# Patient Record
Sex: Male | Born: 1955 | Race: Black or African American | Hispanic: No | Marital: Single | Smoking: Never smoker
Health system: Southern US, Community
[De-identification: ages and names within clinical notes are randomized; demographics above are authoritative.]

## PROBLEM LIST (undated history)

## (undated) DIAGNOSIS — I10 Essential (primary) hypertension: Secondary | ICD-10-CM

## (undated) DIAGNOSIS — E119 Type 2 diabetes mellitus without complications: Secondary | ICD-10-CM

## (undated) DIAGNOSIS — H409 Unspecified glaucoma: Secondary | ICD-10-CM

## (undated) DIAGNOSIS — I509 Heart failure, unspecified: Secondary | ICD-10-CM

---

## 2009-03-14 ENCOUNTER — Inpatient Hospital Stay (HOSPITAL_COMMUNITY): Admission: EM | Admit: 2009-03-14 | Discharge: 2009-03-16 | Payer: Self-pay | Admitting: Emergency Medicine

## 2009-03-14 ENCOUNTER — Ambulatory Visit: Payer: Self-pay | Admitting: Gastroenterology

## 2009-03-15 ENCOUNTER — Encounter: Payer: Self-pay | Admitting: Gastroenterology

## 2009-03-17 ENCOUNTER — Telehealth: Payer: Self-pay | Admitting: Gastroenterology

## 2009-05-16 ENCOUNTER — Telehealth (INDEPENDENT_AMBULATORY_CARE_PROVIDER_SITE_OTHER): Payer: Self-pay | Admitting: *Deleted

## 2010-11-11 LAB — RETICULOCYTES
RBC.: 2 MIL/uL — ABNORMAL LOW (ref 4.22–5.81)
Retic Ct Pct: 11.2 % — ABNORMAL HIGH (ref 0.4–3.1)

## 2010-11-11 LAB — CROSSMATCH

## 2010-11-11 LAB — LIPID PANEL
Cholesterol: 134 mg/dL (ref 0–200)
HDL: 29 mg/dL — ABNORMAL LOW (ref >39)
Triglycerides: 98 mg/dL (ref <150)

## 2010-11-11 LAB — BASIC METABOLIC PANEL
BUN: 13 mg/dL (ref 6–23)
CO2: 26 mEq/L (ref 19–32)
Calcium: 7.9 mg/dL — ABNORMAL LOW (ref 8.4–10.5)
Calcium: 8.3 mg/dL — ABNORMAL LOW (ref 8.4–10.5)
Chloride: 98 mEq/L (ref 96–112)
Creatinine, Ser: 0.97 mg/dL (ref 0.4–1.5)
Creatinine, Ser: 1.05 mg/dL (ref 0.4–1.5)
GFR calc non Af Amer: >60 mL/min (ref >60)
Glucose, Bld: 255 mg/dL — ABNORMAL HIGH (ref 70–99)
Glucose, Bld: 394 mg/dL — ABNORMAL HIGH (ref 70–99)
Sodium: 131 mEq/L — ABNORMAL LOW (ref 135–145)
Sodium: 133 mEq/L — ABNORMAL LOW (ref 135–145)

## 2010-11-11 LAB — DIFFERENTIAL
Basophils Absolute: 0 10*3/uL (ref 0.0–0.1)
Basophils Relative: 0 % (ref 0–1)
Eosinophils Absolute: 0.1 10*3/uL (ref 0.0–0.7)
Monocytes Absolute: 0.9 10*3/uL (ref 0.1–1.0)
Monocytes Relative: 6 % (ref 3–12)
Neutro Abs: 13.5 10*3/uL — ABNORMAL HIGH (ref 1.7–7.7)

## 2010-11-11 LAB — HEMOGLOBIN A1C
Hgb A1c MFr Bld: 8.2 % — ABNORMAL HIGH (ref 4.6–6.1)
Mean Plasma Glucose: 189 mg/dL

## 2010-11-11 LAB — IRON AND TIBC
Iron: <10 ug/dL — ABNORMAL LOW (ref 42–135)
UIBC: 254 ug/dL

## 2010-11-11 LAB — VITAMIN B12: Vitamin B-12: 1616 pg/mL — ABNORMAL HIGH (ref 211–911)

## 2010-11-11 LAB — ABO/RH: ABO/RH(D): A POS

## 2010-11-11 LAB — HEMOGLOBIN AND HEMATOCRIT, BLOOD
HCT: 24.2 % — ABNORMAL LOW (ref 39.0–52.0)
Hemoglobin: 7.9 g/dL — CL (ref 13.0–17.0)

## 2010-11-11 LAB — POCT CARDIAC MARKERS: Troponin i, poc: <0.05 ng/mL (ref 0.00–0.09)

## 2010-11-11 LAB — CBC
HCT: 20.7 % — ABNORMAL LOW (ref 39.0–52.0)
Hemoglobin: 6 g/dL — CL (ref 13.0–17.0)
Hemoglobin: 8.6 g/dL — ABNORMAL LOW (ref 13.0–17.0)
MCHC: 32.9 g/dL (ref 30.0–36.0)
MCHC: 33.2 g/dL (ref 30.0–36.0)
MCV: 89.9 fL (ref 78.0–100.0)
MCV: 90.6 fL (ref 78.0–100.0)
Platelets: 334 10*3/uL (ref 150–400)
RBC: 1.99 MIL/uL — ABNORMAL LOW (ref 4.22–5.81)
RBC: 2.3 MIL/uL — ABNORMAL LOW (ref 4.22–5.81)
RDW: 14.4 % (ref 11.5–15.5)
RDW: 14.4 % (ref 11.5–15.5)
WBC: 12.2 10*3/uL — ABNORMAL HIGH (ref 4.0–10.5)

## 2010-11-11 LAB — COMPREHENSIVE METABOLIC PANEL
BUN: 19 mg/dL (ref 6–23)
CO2: 26 mEq/L (ref 19–32)
Calcium: 8.1 mg/dL — ABNORMAL LOW (ref 8.4–10.5)
Chloride: 98 mEq/L (ref 96–112)
Creatinine, Ser: 1 mg/dL (ref 0.4–1.5)
GFR calc non Af Amer: >60 mL/min (ref >60)
Total Bilirubin: 0.7 mg/dL (ref 0.3–1.2)

## 2010-11-11 LAB — GLUCOSE, CAPILLARY
Glucose-Capillary: 211 mg/dL — ABNORMAL HIGH (ref 70–99)
Glucose-Capillary: 251 mg/dL — ABNORMAL HIGH (ref 70–99)
Glucose-Capillary: 270 mg/dL — ABNORMAL HIGH (ref 70–99)
Glucose-Capillary: 271 mg/dL — ABNORMAL HIGH (ref 70–99)
Glucose-Capillary: 287 mg/dL — ABNORMAL HIGH (ref 70–99)

## 2010-11-11 LAB — T4, FREE: Free T4: 1.05 ng/dL (ref 0.80–1.80)

## 2010-11-11 LAB — PROTIME-INR: INR: 1.1 (ref 0.00–1.49)

## 2010-12-18 NOTE — H&P (Signed)
Timothy Malone, Timothy Malone               ACCOUNT NO.:  [id number]   MEDICAL RECORD NO.:  [id number]          PATIENT TYPE:  INP   LOCATION:  5524                         FACILITY:  [facility]   PHYSICIAN:  Renee Ramus, MD       DATE OF BIRTH:  28-Jan-1956   DATE OF ADMISSION:  03/14/2009  DATE OF DISCHARGE:                              HISTORY & PHYSICAL   PRIMARY CARE PHYSICIAN:  Renaye Rakers, MD   HISTORY OF PRESENT ILLNESS:  The patient is a 55 year old male with  recent history of black tarry stools.  The patient reports having black  tarry stools for approximately 3 weeks prior to admission.  The patient  has had up to 3 syncopal episodes prior to admission.  He was seen by  his primary care physician.  He had labs drawn.  She noted that his  hemoglobin was severely decreased at 6.5.  She asked him to return to  clinic, but prior to his return, he had another syncopal episode.  The  patient was brought to the emergency department and there he was found  to be in profound anemia with a hemoglobin of 6.0.  The patient denies  previous history of GI bleed.  Denies any type of coagulopathy or any  type of recent trauma or predisposing acts towards GI bleeding.  He does  admit to taking Aleve and ibuprofen on a p.r.n. basis.  The patient  denies fevers, chills, night sweats, nausea, vomiting, chest pain,  shortness breath, PND, and orthopnea.  The patient is currently stable,  and we are admitting him for further evaluation and treatment.   PAST MEDICAL HISTORY:  1. Diabetes mellitus type 2, uncontrolled.  2. Hypertension.  3. Hypercholesterolemia.   SOCIAL HISTORY:  The patient reports drinking 2-3 beers daily.  Denies  tobacco or drug use.   Family history is not available.   REVIEW OF SYSTEMS:  All other comprehensive review systems are negative.   MEDICATIONS:  The patient has no known drug allergies.   CURRENT MEDICATIONS:  1. Metformin 1 g p.o. q.a.m. and 1500 mg p.o. nightly.  2. Actos 30 mg p.o. daily.  3. Lisinopril/hydrochlorothiazide 20/12.5 one p.o. daily.  4. Incretin which is his study drug.  He may or may not be on this      drug.  He may be on placebo arm, we are not sure at this point.   PHYSICAL EXAMINATION:  GENERAL:  This is a well-developed, well-  nourished black male currently in no apparent distress.  VITAL SIGNS:  Blood pressure 103/60, heart rate 117, respiratory rate  22, temperature 98.  HEENT:  Oropharynx is clear.  Mucous membranes pink and moist.  TMs  clear bilaterally.  Pupils equal, reactive to light and accommodation.  Extraocular muscles are intact.  NECK:  No jugular venous distention or lymphadenopathy.  CARDIOVASCULAR:  Regular rate and rhythm without murmurs, rubs, or  gallops.  PULMONARY:  Lungs are clear to auscultation bilaterally.  ABDOMEN:  Soft, nontender, nondistended without hepatosplenomegaly.  Bowel sounds are present.  He has no rebound or guarding.  EXTREMITIES:  He has no clubbing, cyanosis, or edema.  He has good  peripheral pulses in dorsalis pedis and radial arteries.  He is able to  move all extremities.  NEURO:  Cranial nerves II through XII are grossly intact.  He has no  focal neurological deficits.   STUDIES:  1. Chest x-ray shows no acute disease.  2. EKG shows sinus tachycardia with one premature ventricular      contraction, but no other abnormalities.   LABORATORY DATA:  White count 15.2, H and H 6.0 and 18, MCV 91,  platelets 421.  Sodium 131, potassium 4.5, chloride 98, bicarb 26, BUN  15, creatinine 1.0, glucose 394.  Fecal occult blood is positive.   ASSESSMENT/PLAN:  1. Gastrointestinal bleed.  The patient is likely suffering from a GI      bleed, which is likely upper given his presentation, may be      gastritis or ulcer secondary to alcohol and NSAID use.  The patient      reports having colonoscopy last year with 4 polyps found.  He does      not have a report of this and this was done  [place].  We have      consulted [facility].  We will check a PT/INR.  An acute anemia      panel is pending.  We have also discussed this with the emergency      room physician and anticipate the patient will have an EGD done      either today or tomorrow and if necessary colonoscopy.  We will      transfuse him 2 units packed RBCs and keep 2 units in reserve.  We      will start him on PPI and give him liquid diet.  2. Diabetes mellitus type 2, uncontrolled.  We will place the patient      on low-level Lantus, place him on sliding scale insulin.  For now,      we will hold p.o. medications.  We will check hemoglobin A1c and      lipid panel.  3. Hypertension.  Since the patient is currently hypotensive and      anemic, we will hold his blood pressure medications, but I would      anticipate starting him on at least lisinopril at discharge.  4. Disposition.  The patient is full code.   H and P was constructed by reviewing past medical history conferring  with emergency medical room physician and reviewing the emergency  medical record.   TIME SPENT:  1 hour.      Renee Ramus, MD  Electronically Signed     JF/MEDQ  D:  03/14/2009  T:  03/15/2009  Job:  [id number]   cc:   Renaye Rakers, M.D.

## 2010-12-18 NOTE — Discharge Summary (Signed)
Timothy Malone, Timothy Malone               ACCOUNT NO.:  [id number]   MEDICAL RECORD NO.:  [id number]          PATIENT TYPE:  INP   LOCATION:  5524                         FACILITY:  [facility]   PHYSICIAN:  Charlestine Massed, MDDATE OF BIRTH:  08-13-1955   DATE OF ADMISSION:  03/14/2009  DATE OF DISCHARGE:  03/16/2009                               DISCHARGE SUMMARY   PRIMARY CARE PHYSICIAN:  Renaye Rakers, MD   GASTROENTEROLOGY:  Rachael Fee, MD   REASON FOR ADMISSION:  Black tarry stools and syncopal episodes.   DISCHARGE DIAGNOSES:  1. Upper need gastrointestinal bleed with multiple gastric ulcers      secondary to nonsteroidal antiinflammatory drug use.  2. Anemia of acute blood loss with syncope secondary to hypovolemia,      currently corrected.  3. Diabetes mellitus type 2, uncontrolled and noncompliance.  4. Hypertension, currently controlled.  5. Dyslipidemia with low HDL levels.   DISCHARGE MEDICATIONS:  1. Metformin 500 mg tablets 2 tablets in the a.m. and 3 tablets at      night by mouth.  2. Actos 30 mg by mouth daily.  3. Incretin - study drug 1 tablet by mouth daily.  4. Lisinopril 20 mg by mouth daily.  5. Niaspan 1000 mg by mouth at night with meals.   The medication that was stopped is lisinopril/hydrochlorothiazide  combination.  The patient has been taken off hydrochlorothiazide.   HOSPITAL COURSE:  1. Upper GI bleed.  Mr. Arcadio Cope is a 55 year old African      American male who came with complaints of black tarry stools over      the past 2-3 weeks.  He has been taking high doses of nonsteroidal      anti-inflammatory drugs for his back pain on a p.r.n. basis,      especially Aleve and ibuprofen.  His hemoglobin was found to be 6.5      and he had 2-3 syncopal episode prior to admission also.  He was      put on n.p.o. and started on IV blood transfusion.  The patient was      seen by [facility] and underwent endoscopy by Dr.      Rob Bunting and endoscopy revealed a large deep gastric ulcer in      the proximal body of the stomach.  We discussed with him the cause.      No idea of cause on visible vessel present with a clear base.  The      patient's mucous was not adenomatous.  There was also a 1-2 cm      clean-based duodenal bulb ulcer causing anatomic deformity and the      biopsies were taken for H. pylori and mild reflux esophagitis was      also seen.  The pathology report is still pending.      Gastroenterology has written that their office will call the      patient for further followup.  Pathology reports will also be      followed by Gastroenterology, as they ordered it at the time  of EGD      and the patient will be started on appropriate H. pylori regimen if      is H. pylori positive.   Currently, his hemoglobin is stable and GI followup has been done, and  he has been advised clearly not to avoid NSAIDs for any reason.  The  patient will need a repeat EGD in another 2 months period as per GI and  [facility] will set it up as outpatient.  Currently, the patient's  hemoglobin is stable.  He has been started on regular consistency diet  and if he tolerates, he will be discharged in the afternoon.  1. Anemia of acute blood loss.  The patient has been transfused and      his hemoglobin is currently stable and no further drop seen and no      evidence of any further bleed.  2. Diabetes.  The patient has been on Incretin study drug and      metformin and Actos.  We will continue them.  He will also need to      be on NovoLog sliding scale and the patient has been educated here,      so we will continue that for proper coverage as his hemoglobin A1c      is 8.2.  3. Hypertension.  He has been discontinued off hydrochlorothiazide.      We will continue only on lisinopril for his hypertension and to      follow up with Dr. Renaye Rakers for further followup of that.  4. Dyslipidemia.  His LDL is normal but he has  a low HDL and in view      of his metabolic syndrome, I will add Niaspan to the regimen and      the patient has to take the aspirin.  In view of the fact that the      patient has upper GI bleed, he cannot take aspirin and so he will      just take an aspirin with a meal and she has to explain about the      side effects of the aspirin.   DISPOSITION:  Discharged back home.   FOLLOWUP:  1. Follow up with Dr. Parke Simmers in 1 week's time. Dr.Bland to check the      CBC at that timer to see his hemoglobin level.  2. Follow up with Dr. Rob Bunting and [facility] will call for      appointment.   INSTRUCTIONS:  1. Avoid nonsteroidal drugs, especially Aleve, ibuprofen, Advil,      Imuran, and to take just Tylenol for any pain.  2. Proper medication adherence and blood sugar control.   TIME SPENT:  A total of 40 minutes were spent on today's discharge.      Charlestine Massed, MD  Electronically Signed     UT/MEDQ  D:  03/16/2009  T:  03/17/2009  Job:  [id number]   cc:   Renaye Rakers, M.D.  Rachael Fee, MD

## 2015-09-08 ENCOUNTER — Inpatient Hospital Stay
Admission: EM | Admit: 2015-09-08 | Discharge: 2015-09-10 | DRG: 640 | Disposition: A | Discharge status: Home / Self Care | Payer: Federal, State, Local not specified - PPO | Attending: Internal Medicine | Admitting: Internal Medicine

## 2015-09-08 ENCOUNTER — Inpatient Hospital Stay: Payer: Federal, State, Local not specified - PPO

## 2015-09-08 ENCOUNTER — Encounter: Payer: Self-pay | Admitting: Emergency Medicine

## 2015-09-08 DIAGNOSIS — R609 Edema, unspecified: Secondary | ICD-10-CM

## 2015-09-08 DIAGNOSIS — E119 Type 2 diabetes mellitus without complications: Secondary | ICD-10-CM | POA: Diagnosis present

## 2015-09-08 DIAGNOSIS — I110 Hypertensive heart disease with heart failure: Secondary | ICD-10-CM | POA: Diagnosis present

## 2015-09-08 DIAGNOSIS — R600 Localized edema: Secondary | ICD-10-CM

## 2015-09-08 DIAGNOSIS — Z794 Long term (current) use of insulin: Secondary | ICD-10-CM

## 2015-09-08 DIAGNOSIS — I340 Nonrheumatic mitral (valve) insufficiency: Secondary | ICD-10-CM | POA: Diagnosis not present

## 2015-09-08 DIAGNOSIS — L0100 Impetigo, unspecified: Secondary | ICD-10-CM | POA: Diagnosis present

## 2015-09-08 DIAGNOSIS — F1010 Alcohol abuse, uncomplicated: Secondary | ICD-10-CM | POA: Diagnosis present

## 2015-09-08 DIAGNOSIS — E876 Hypokalemia: Secondary | ICD-10-CM | POA: Diagnosis present

## 2015-09-08 DIAGNOSIS — I5033 Acute on chronic diastolic (congestive) heart failure: Secondary | ICD-10-CM | POA: Diagnosis present

## 2015-09-08 DIAGNOSIS — H409 Unspecified glaucoma: Secondary | ICD-10-CM | POA: Diagnosis present

## 2015-09-08 DIAGNOSIS — Z79899 Other long term (current) drug therapy: Secondary | ICD-10-CM | POA: Diagnosis not present

## 2015-09-08 DIAGNOSIS — E870 Hyperosmolality and hypernatremia: Secondary | ICD-10-CM | POA: Diagnosis present

## 2015-09-08 DIAGNOSIS — T502X5A Adverse effect of carbonic-anhydrase inhibitors, benzothiadiazides and other diuretics, initial encounter: Secondary | ICD-10-CM | POA: Diagnosis present

## 2015-09-08 DIAGNOSIS — R29898 Other symptoms and signs involving the musculoskeletal system: Secondary | ICD-10-CM

## 2015-09-08 HISTORY — DX: Heart failure, unspecified: I50.9

## 2015-09-08 HISTORY — DX: Essential (primary) hypertension: I10

## 2015-09-08 HISTORY — DX: Unspecified glaucoma: H40.9

## 2015-09-08 HISTORY — DX: Type 2 diabetes mellitus without complications: E11.9

## 2015-09-08 LAB — COMPREHENSIVE METABOLIC PANEL
ALK PHOS: 85 U/L (ref 38–126)
ALT: 73 U/L — ABNORMAL HIGH (ref 17–63)
ANION GAP: 7 (ref 5–15)
AST: 49 U/L — ABNORMAL HIGH (ref 15–41)
Albumin: 2.6 g/dL — ABNORMAL LOW (ref 3.5–5.0)
BILIRUBIN TOTAL: 1 mg/dL (ref 0.3–1.2)
BUN: 15 mg/dL (ref 6–20)
CALCIUM: 7.4 mg/dL — AB (ref 8.9–10.3)
CO2: 42 mmol/L — ABNORMAL HIGH (ref 22–32)
Chloride: 94 mmol/L — ABNORMAL LOW (ref 101–111)
Creatinine, Ser: 0.6 mg/dL — ABNORMAL LOW (ref 0.61–1.24)
Glucose, Bld: 63 mg/dL — ABNORMAL LOW (ref 65–99)
Sodium: 143 mmol/L (ref 135–145)
TOTAL PROTEIN: 5.5 g/dL — AB (ref 6.5–8.1)

## 2015-09-08 LAB — CBC
HEMATOCRIT: 37.5 % — AB (ref 40.0–52.0)
HEMOGLOBIN: 12.2 g/dL — AB (ref 13.0–18.0)
MCH: 30.8 pg (ref 26.0–34.0)
MCHC: 32.6 g/dL (ref 32.0–36.0)
MCV: 94.5 fL (ref 80.0–100.0)
Platelets: 282 10*3/uL (ref 150–440)
RBC: 3.96 MIL/uL — ABNORMAL LOW (ref 4.40–5.90)
RDW: 13.3 % (ref 11.5–14.5)
WBC: 13.1 10*3/uL — AB (ref 3.8–10.6)

## 2015-09-08 LAB — MAGNESIUM: MAGNESIUM: 1.7 mg/dL (ref 1.7–2.4)

## 2015-09-08 LAB — GLUCOSE, CAPILLARY: Glucose-Capillary: 225 mg/dL — ABNORMAL HIGH (ref 65–99)

## 2015-09-08 MED ORDER — HEPARIN SODIUM (PORCINE) 5000 UNIT/ML IJ SOLN
5000.0000 [IU] | Freq: Three times a day (TID) | INTRAMUSCULAR | Status: DC
  Administered 2015-09-09 – 2015-09-10 (×5): 5000 [IU] via SUBCUTANEOUS
  Filled 2015-09-08 (×5): qty 1

## 2015-09-08 MED ORDER — INSULIN ASPART 100 UNIT/ML ~~LOC~~ SOLN
0.0000 [IU] | Freq: Every day | SUBCUTANEOUS | Status: DC
  Administered 2015-09-09: 5 [IU] via SUBCUTANEOUS
  Administered 2015-09-09: 2 [IU] via SUBCUTANEOUS
  Filled 2015-09-08: qty 2
  Filled 2015-09-08 (×2): qty 5

## 2015-09-08 MED ORDER — LORAZEPAM 2 MG/ML IJ SOLN: 1.0000 mg | Freq: Four times a day (QID) | INTRAMUSCULAR | Status: DC | PRN

## 2015-09-08 MED ORDER — SODIUM CHLORIDE 0.9% FLUSH: 3.0000 mL | INTRAVENOUS | Status: DC | PRN

## 2015-09-08 MED ORDER — ADULT MULTIVITAMIN W/MINERALS CH
1.0000 | ORAL_TABLET | Freq: Every day | ORAL | Status: DC
  Administered 2015-09-09 – 2015-09-10 (×2): 1 via ORAL
  Filled 2015-09-08 (×2): qty 1

## 2015-09-08 MED ORDER — FOLIC ACID 1 MG PO TABS
1.0000 mg | ORAL_TABLET | Freq: Every day | ORAL | Status: DC
  Administered 2015-09-09 – 2015-09-10 (×2): 1 mg via ORAL
  Filled 2015-09-08 (×2): qty 1

## 2015-09-08 MED ORDER — ONDANSETRON HCL 4 MG/2ML IJ SOLN: 4.0000 mg | Freq: Four times a day (QID) | INTRAMUSCULAR | Status: DC | PRN

## 2015-09-08 MED ORDER — THIAMINE HCL 100 MG/ML IJ SOLN
100.0000 mg | Freq: Every day | INTRAMUSCULAR | Status: DC
  Filled 2015-09-08: qty 2

## 2015-09-08 MED ORDER — SODIUM CHLORIDE 0.9 % IV SOLN: 250.0000 mL | INTRAVENOUS | Status: DC | PRN

## 2015-09-08 MED ORDER — INSULIN ASPART 100 UNIT/ML ~~LOC~~ SOLN
0.0000 [IU] | Freq: Three times a day (TID) | SUBCUTANEOUS | Status: DC
  Administered 2015-09-09: 9 [IU] via SUBCUTANEOUS
  Administered 2015-09-09: 5 [IU] via SUBCUTANEOUS
  Administered 2015-09-09 – 2015-09-10 (×2): 7 [IU] via SUBCUTANEOUS
  Administered 2015-09-10: 3 [IU] via SUBCUTANEOUS
  Administered 2015-09-10: 5 [IU] via SUBCUTANEOUS
  Filled 2015-09-08: qty 7
  Filled 2015-09-08: qty 5
  Filled 2015-09-08: qty 7
  Filled 2015-09-08: qty 3
  Filled 2015-09-08: qty 9

## 2015-09-08 MED ORDER — METOPROLOL TARTRATE 25 MG PO TABS
25.0000 mg | ORAL_TABLET | Freq: Two times a day (BID) | ORAL | Status: DC
  Administered 2015-09-09 – 2015-09-10 (×4): 25 mg via ORAL
  Filled 2015-09-08 (×4): qty 1

## 2015-09-08 MED ORDER — ALBUTEROL SULFATE (2.5 MG/3ML) 0.083% IN NEBU: 2.5000 mg | INHALATION_SOLUTION | RESPIRATORY_TRACT | Status: DC | PRN

## 2015-09-08 MED ORDER — ACETAMINOPHEN 325 MG PO TABS: 650.0000 mg | ORAL_TABLET | Freq: Four times a day (QID) | ORAL | Status: DC | PRN

## 2015-09-08 MED ORDER — LORAZEPAM 1 MG PO TABS: 1.0000 mg | ORAL_TABLET | Freq: Four times a day (QID) | ORAL | Status: DC | PRN

## 2015-09-08 MED ORDER — SODIUM CHLORIDE 0.9% FLUSH
3.0000 mL | Freq: Two times a day (BID) | INTRAVENOUS | Status: DC
  Administered 2015-09-08 – 2015-09-10 (×3): 3 mL via INTRAVENOUS

## 2015-09-08 MED ORDER — SODIUM CHLORIDE 0.9% FLUSH: 3.0000 mL | Freq: Two times a day (BID) | INTRAVENOUS | Status: DC

## 2015-09-08 MED ORDER — MAGNESIUM SULFATE 2 GM/50ML IV SOLN
2.0000 g | Freq: Once | INTRAVENOUS | Status: AC
  Administered 2015-09-08: 2 g via INTRAVENOUS
  Filled 2015-09-08: qty 50

## 2015-09-08 MED ORDER — ONDANSETRON HCL 4 MG PO TABS: 4.0000 mg | ORAL_TABLET | Freq: Four times a day (QID) | ORAL | Status: DC | PRN

## 2015-09-08 MED ORDER — FUROSEMIDE 10 MG/ML IJ SOLN
20.0000 mg | Freq: Two times a day (BID) | INTRAMUSCULAR | Status: DC
  Administered 2015-09-09 (×3): 20 mg via INTRAVENOUS
  Filled 2015-09-08 (×3): qty 2

## 2015-09-08 MED ORDER — INSULIN GLARGINE 100 UNIT/ML ~~LOC~~ SOLN
30.0000 [IU] | Freq: Every day | SUBCUTANEOUS | Status: DC
  Administered 2015-09-09: 30 [IU] via SUBCUTANEOUS
  Filled 2015-09-08 (×3): qty 0.3

## 2015-09-08 MED ORDER — POTASSIUM CHLORIDE 10 MEQ/100ML IV SOLN
10.0000 meq | INTRAVENOUS | Status: AC
  Administered 2015-09-08 (×2): 10 meq via INTRAVENOUS
  Filled 2015-09-08 (×3): qty 100

## 2015-09-08 MED ORDER — POTASSIUM CHLORIDE CRYS ER 20 MEQ PO TBCR
40.0000 meq | EXTENDED_RELEASE_TABLET | Freq: Two times a day (BID) | ORAL | Status: DC
  Administered 2015-09-09: 40 meq via ORAL
  Filled 2015-09-08: qty 2

## 2015-09-08 MED ORDER — ACETAMINOPHEN 650 MG RE SUPP: 650.0000 mg | Freq: Four times a day (QID) | RECTAL | Status: DC | PRN

## 2015-09-08 MED ORDER — VITAMIN B-1 100 MG PO TABS
100.0000 mg | ORAL_TABLET | Freq: Every day | ORAL | Status: DC
  Administered 2015-09-09 – 2015-09-10 (×2): 100 mg via ORAL
  Filled 2015-09-08 (×2): qty 1

## 2015-09-08 MED ORDER — POTASSIUM CHLORIDE 10 MEQ/100ML IV SOLN
10.0000 meq | INTRAVENOUS | Status: AC
  Administered 2015-09-09 (×3): 10 meq via INTRAVENOUS
  Filled 2015-09-08 (×4): qty 100

## 2015-09-08 NOTE — ED Notes (Signed)
Pt sent here from [facility] for hypokalemia and bilat leg edema.  Pt states noted swelling for few days.  Large amt of pitting edema noted to LE extremities bilat.  Healing ulcers noted to legs and superficial abrasions x 2 noted to left leg from patient accidentally scratching his leg. Pt has no complaints of pain or shob, denies hx of the same.  Weeping noted to both legs, sterile gauze and coban applied to left leg abrasions.

## 2015-09-08 NOTE — ED Notes (Signed)
Patient went to [facility] for Bilateral pedal edema. Sent here for hypokalemia

## 2015-09-08 NOTE — ED Notes (Signed)
Dr Imogene Burn in to admit

## 2015-09-08 NOTE — ED Notes (Signed)
Pt awaiting for room assignment, no distress noted.

## 2015-09-08 NOTE — H&P (Addendum)
[facility]   PATIENT NAME: Timothy Malone    MR#:  [id number]  DATE OF BIRTH:  1955-09-04  DATE OF ADMISSION:  09/08/2015  PRIMARY CARE PHYSICIAN: No primary care provider on file.   REQUESTING/REFERRING PHYSICIAN: Sharyn Creamer, MD  CHIEF COMPLAINT:   Chief Complaint  Patient presents with  . Leg Swelling   bilateral leg swelling for 10 days.  HISTORY OF PRESENT ILLNESS:  Timothy Malone  is a 60 y.o. male with a known history of hypertension, diabetes and CHF. The patient has had the leg swelling for the past 10 days. He was found to have low potassium in the urgent care and sent to ED for further evaluation. He denies any fever or chills, no chest pain, palpitation or cough or shortness breath. No orthopnea or nocturnal dyspnea. But has generalized weakness. His potassium in the ED is less than 2. He is being treated with potassium IV 20 mEq now.  PAST MEDICAL HISTORY:   Past Medical History  Diagnosis Date  . CHF (congestive heart failure) (HCC)   . Diabetes mellitus without complication (HCC)   . Glaucoma   . Hypertension     PAST SURGICAL HISTORY:  History reviewed. No pertinent past surgical history.  SOCIAL HISTORY:   Social History  Substance Use Topics  . Smoking status: Not on file  . Smokeless tobacco: Not on file  . Alcohol Use: 1.2 oz/week    2 Cans of beer per week   he denies any smoking or drug abuse.   FAMILY HISTORY:  No family history on file. the patient denies any family history of hypertension, diabetes, heart attack or stroke or cancer.   DRUG ALLERGIES:  No Known Allergies  REVIEW OF SYSTEMS:  CONSTITUTIONAL: No fever,but has generalized weakness.  EYES: No blurred or double vision.  EARS, NOSE, AND THROAT: No tinnitus or ear pain.  RESPIRATORY: No cough, shortness of breath, wheezing or hemoptysis.  CARDIOVASCULAR: No chest pain, orthopnea,  but has bilateral leg edema.  GASTROINTESTINAL: No  nausea, vomiting, diarrhea or abdominal pain.  GENITOURINARY: No dysuria, hematuria.  ENDOCRINE: No polyuria, nocturia,  HEMATOLOGY: No anemia, easy bruising or bleeding SKIN: No rash or lesion. MUSCULOSKELETAL: No joint pain or arthritis.   NEUROLOGIC: No tingling, numbness, weakness.  PSYCHIATRY: No anxiety or depression.   MEDICATIONS AT HOME:   Prior to Admission medications   Medication Sig Start Date End Date Taking? Authorizing Provider  furosemide (LASIX) 20 MG tablet Take 20 mg by mouth daily.   Yes Historical Provider, MD  insulin aspart (NOVOLOG) 100 UNIT/ML injection Inject 0-25 Units into the skin 3 (three) times daily with meals as needed for high blood sugar. Pt uses as needed per sliding scale.   Yes Historical Provider, MD  insulin glargine (LANTUS) 100 UNIT/ML injection Inject 30 Units into the skin at bedtime.    Yes Historical Provider, MD      VITAL SIGNS:  Blood pressure 168/95, pulse 63, temperature 98 F (36.7 C), temperature source Oral, resp. rate 7, height  (1.88 m), weight 122.018 kg (269 lb), SpO2 90 %.  PHYSICAL EXAMINATION:  GENERAL:  60 y.o.-year-old patient lying in the bed with no acute distress.  morbid obese.  EYES: Pupils equal, round, reactive to light and accommodation. No scleral icterus. Extraocular muscles intact.  HEENT: Head atraumatic, normocephalic. Oropharynx and nasopharynx clear.  NECK:  Supple, no jugular venous distention. No thyroid enlargement, no tenderness.  LUNGS:  Normal breath sounds bilaterally, no wheezing, rales,rhonchi or crepitation. No use of accessory muscles of respiration.  CARDIOVASCULAR: S1, S2 normal. No murmurs, rubs, or gallops.  ABDOMEN: Soft, nontender, nondistended. Bowel sounds present. No organomegaly or mass.  EXTREMITIES:   bilateral leg edema 3+, no cyanosis, or clubbing.  NEUROLOGIC: Cranial nerves II through XII are intact. Muscle strength 4/5 in all extremities. Sensation intact. Gait not checked.   PSYCHIATRIC: The patient is alert and oriented x 3.  SKIN: No obvious rash, lesion, or ulcer.   LABORATORY PANEL:   CBC  Recent Labs Lab 09/08/15 1840  WBC 13.1*  HGB 12.2*  HCT 37.5*  PLT 282   ------------------------------------------------------------------------------------------------------------------  Chemistries   Recent Labs Lab 09/08/15 1840  NA 143  K <2.0*  CL 94*  CO2 42*  GLUCOSE 63*  BUN 15  CREATININE 0.60*  CALCIUM 7.4*  AST 49*  ALT 73*  ALKPHOS 85  BILITOT 1.0   ------------------------------------------------------------------------------------------------------------------  Cardiac Enzymes No results for input(s): TROPONINI in the last 168 hours. ------------------------------------------------------------------------------------------------------------------  RADIOLOGY:  No results found.  EKG:   Orders placed or performed during the hospital encounter of 09/08/15  . EKG 12-Lead  . EKG 12-Lead  . ED EKG  . ED EKG    IMPRESSION AND PLAN:   Severe hypokalemia. The patient is being treated with IV potassium, I will give more IV and by mouth potassium, follow-up BMP and magnesium level.  Leg edema. I will continue Lasix with potassium supplement.  Hypertension. Start Lopressor and continue Lasix. Alcohol abuse. Start CIWA protocol. Chronic CHF. Continue lasix. Get echocardiogram. Diabetes. Start sliding scale. Morbid obesity.   All the records are reviewed and case discussed with ED provider. Management plans discussed with the patient, family and they are in agreement.  CODE STATUS: Full code  TOTAL TIME TAKING CARE OF THIS PATIENT: 55 minutes.    Shaune Pollack M.D on 09/08/2015 at 8:49 PM  Between 7am to 6pm - Pager - [phone]  After 6pm go to [email or web address] - password [email or web address] [facility]  [place] [facility]  Office  [phone]  CC: Primary care physician; No primary care provider on file.

## 2015-09-08 NOTE — ED Provider Notes (Signed)
[facility] Emergency Department Provider Note  ____________________________________________  Time seen: Approximately 7:25 PM  I have reviewed the triage vital signs and the nursing notes.   HISTORY  Chief Complaint Leg Swelling    HPI Timothy Malone is a 60 y.o. male presents for evaluation of a low potassium level.  Patient tells me he's been on Lasix for about a month due to leg swelling. He came to the urgent care because he still has ongoing swelling in both legs. He had blood checked and they told him his potassium was very very low and sent him to the ER. Denies shortness of breath, no pain. Porges legs been swollen for over a month. He does report a history of diabetes.  No nausea or vomiting. He denies taking any potassium supplement.Patient does report he had a fall several weeks ago where he hit his right shoulder is been having some slight pain and weakness in the right arm since that time.   Past Medical History  Diagnosis Date  . CHF (congestive heart failure) (HCC)   . Diabetes mellitus without complication (HCC)   . Glaucoma   . Hypertension     There are no active problems to display for this patient.   History reviewed. No pertinent past surgical history.  Current Outpatient Rx  Name  Route  Sig  Dispense  Refill  . furosemide (LASIX) 20 MG tablet   Oral   Take 20 mg by mouth daily.         . insulin aspart (NOVOLOG) 100 UNIT/ML injection   Subcutaneous   Inject 0-25 Units into the skin 3 (three) times daily with meals as needed for high blood sugar. Pt uses as needed per sliding scale.         . insulin glargine (LANTUS) 100 UNIT/ML injection   Subcutaneous   Inject 30 Units into the skin at bedtime.            Allergies Review of patient's allergies indicates no known allergies.  No family history on file.  Social History Social History  Substance Use Topics  . Smoking status: None  . Smokeless tobacco:  None  . Alcohol Use: 1.2 oz/week    2 Cans of beer per week    Review of Systems Constitutional: No fever/chills Eyes: No visual changes. ENT: No sore throat. Cardiovascular: Denies chest pain. Respiratory: Denies shortness of breath. Gastrointestinal: No abdominal pain.  No nausea, no vomiting.  No diarrhea.  No constipation. Genitourinary: Negative for dysuria. Musculoskeletal: Negative for back pain. Skin: Negative for rash. Neurological: Negative for headaches, focal weakness or numbness.  10-point ROS otherwise negative.  ____________________________________________   PHYSICAL EXAM:  VITAL SIGNS: ED Triage Vitals  Enc Vitals Group     BP 09/08/15 1800 172/92 mmHg     Pulse Rate 09/08/15 1800 86     Resp 09/08/15 1800 20     Temp 09/08/15 1800 98 F (36.7 C)     Temp Source 09/08/15 1800 Oral     SpO2 09/08/15 1800 96 %     Weight 09/08/15 1800 269 lb (122.018 kg)     Height 09/08/15 1800  (1.88 m)     Head Cir --      Peak Flow --      Pain Score --      Pain Loc --      Pain Edu? --      Excl. in GC? --    Constitutional:  Alert and oriented. Well appearing and in no acute distress. Eyes: Conjunctivae are normal. PERRL. EOMI. Head: Atraumatic. Nose: No congestion/rhinnorhea. Mouth/Throat: Mucous membranes are moist.  Oropharynx non-erythematous. Neck: No stridor.  No cervical spine tenderness. Cardiovascular: Normal rate, regular rhythm. Grossly normal heart sounds.  Good peripheral circulation. Respiratory: Normal respiratory effort.  No retractions. Lungs CTAB. Gastrointestinal: Soft and nontender. No distention. No abdominal bruits. No CVA tenderness. Musculoskeletal: 3+ lower extremity edema bilateral.  Neurologic:  Normal speech and language. No gross focal neurologic deficits are appreciated except some difficulty abducting around the right shoulder. Normal facial expressions. No facial droop. No numbness or tingling in the right hand. Median ulnar  and radial nerve function normal and right hand. Normal strength in the legs bilateral. No gait instability. Skin:  Skin is warm, dry and intact. No rash noted. Psychiatric: Mood and affect are normal. Speech and behavior are normal.  ____________________________________________   LABS (all labs ordered are listed, but only abnormal results are displayed)  Labs Reviewed  COMPREHENSIVE METABOLIC PANEL - Abnormal; Notable for the following:    Potassium <2.0 (*)    Chloride 94 (*)    CO2 42 (*)    Glucose, Bld 63 (*)    Creatinine, Ser 0.60 (*)    Calcium 7.4 (*)    Total Protein 5.5 (*)    Albumin 2.6 (*)    AST 49 (*)    ALT 73 (*)    All other components within normal limits  CBC - Abnormal; Notable for the following:    WBC 13.1 (*)    RBC 3.96 (*)    Hemoglobin 12.2 (*)    HCT 37.5 (*)    All other components within normal limits   ____________________________________________  EKG  Reviewed and interpreted by me at 1930 Ventricular rate 95 Normal sinus rhythm with occasional PVC QTc normal limits QRS 106 Minimal and diffuse T-wave flattening without evidence of acute ischemic abnormality ____________________________________________  RADIOLOGY   ____________________________________________   PROCEDURES  Procedure(s) performed: None  Critical Care performed: No  CRITICAL CARE Performed by: Sharyn Creamer   Total critical care time: 35 minutes  Critical care time was exclusive of separately billable procedures and treating other patients.  Critical care was necessary to treat or prevent imminent or life-threatening deterioration.  Critical care was time spent personally by me on the following activities: development of treatment plan with patient and/or surrogate as well as nursing, discussions with consultants, evaluation of patient's response to treatment, examination of patient, obtaining history from patient or surrogate, ordering and performing  treatments and interventions, ordering and review of laboratory studies, ordering and review of radiographic studies, pulse oximetry and re-evaluation of patient's condition.  ____________________________________________   INITIAL IMPRESSION / ASSESSMENT AND PLAN / ED COURSE  Pertinent labs & imaging results that were available during my care of the patient were reviewed by me and considered in my medical decision making (see chart for details).  Hypokalemia, potassium less than 2. Given severe hypokalemia will admit the patient to the hospital for IV repletion. Suspect likely due to use of Lasix without being on potassium supplementation. He has peripheral edema which is bilateral, equal, and pitting. This will need further addressing by the hospitalist service.  Patient also has weakness in the right upper arm with abduction. Discussed with the patient advised to CT scan to make sure he did not have a "stroke" but he refuses this helmet of care. Reports at this time that he knows  that he just bruised his shoulder and will follow up with his doctor after leaving the hospital for this. Based on his neurologic exam and do not believe he had acute stroke, he may have a neuropathy secondary to an injury and falling and hitting the right shoulder. He refused x-rays and further evaluation and states he does not wish to have them performed, does not wish for a CAT scan. ____________________________________________   FINAL CLINICAL IMPRESSION(S) / ED DIAGNOSES  Final diagnoses:  Hypokalemia  Peripheral edema  Right arm weakness      Sharyn Creamer, MD 09/08/15 1948

## 2015-09-08 NOTE — ED Notes (Signed)
Report received, care assumed.

## 2015-09-08 NOTE — ED Notes (Signed)
Pt eating meal tray 

## 2015-09-08 NOTE — ED Notes (Signed)
Pt resting comfortably awaiting bed assignment, no distress noted.

## 2015-09-09 ENCOUNTER — Inpatient Hospital Stay (HOSPITAL_COMMUNITY)
Admit: 2015-09-09 | Discharge: 2015-09-09 | Disposition: A | Discharge status: Home / Self Care | Payer: Federal, State, Local not specified - PPO | Attending: Internal Medicine | Admitting: Internal Medicine

## 2015-09-09 DIAGNOSIS — I340 Nonrheumatic mitral (valve) insufficiency: Secondary | ICD-10-CM

## 2015-09-09 LAB — GLUCOSE, CAPILLARY
GLUCOSE-CAPILLARY: 262 mg/dL — AB (ref 65–99)
GLUCOSE-CAPILLARY: 314 mg/dL — AB (ref 65–99)
GLUCOSE-CAPILLARY: 373 mg/dL — AB (ref 65–99)
GLUCOSE-CAPILLARY: 371 mg/dL — AB (ref 65–99)
Glucose-Capillary: 242 mg/dL — ABNORMAL HIGH (ref 65–99)

## 2015-09-09 LAB — CBC
HCT: 37.4 % — ABNORMAL LOW (ref 40.0–52.0)
HEMOGLOBIN: 12 g/dL — AB (ref 13.0–18.0)
MCH: 30.7 pg (ref 26.0–34.0)
MCHC: 32.1 g/dL (ref 32.0–36.0)
MCV: 95.8 fL (ref 80.0–100.0)
Platelets: 264 10*3/uL (ref 150–440)
RBC: 3.9 MIL/uL — AB (ref 4.40–5.90)
RDW: 13.4 % (ref 11.5–14.5)
WBC: 11.5 10*3/uL — ABNORMAL HIGH (ref 3.8–10.6)

## 2015-09-09 LAB — URINALYSIS COMPLETE WITH MICROSCOPIC (ARMC ONLY)
Bilirubin Urine: NEGATIVE
Glucose, UA: >500 mg/dL — AB
HGB URINE DIPSTICK: NEGATIVE
Ketones, ur: NEGATIVE mg/dL
LEUKOCYTES UA: NEGATIVE
NITRITE: NEGATIVE
PH: 6 (ref 5.0–8.0)
PROTEIN: NEGATIVE mg/dL
Specific Gravity, Urine: 1.005 (ref 1.005–1.030)
Squamous Epithelial / LPF: NONE SEEN

## 2015-09-09 LAB — BASIC METABOLIC PANEL
ANION GAP: 9 (ref 5–15)
Anion gap: 12 (ref 5–15)
BUN: 15 mg/dL (ref 6–20)
BUN: 17 mg/dL (ref 6–20)
CALCIUM: 7.1 mg/dL — AB (ref 8.9–10.3)
CHLORIDE: 92 mmol/L — AB (ref 101–111)
CHLORIDE: 91 mmol/L — AB (ref 101–111)
CO2: 45 mmol/L — AB (ref 22–32)
CO2: 40 mmol/L — AB (ref 22–32)
CREATININE: 0.68 mg/dL (ref 0.61–1.24)
CREATININE: 0.72 mg/dL (ref 0.61–1.24)
Calcium: 7.3 mg/dL — ABNORMAL LOW (ref 8.9–10.3)
GFR calc non Af Amer: >60 mL/min (ref >60)
GFR calc non Af Amer: >60 mL/min (ref >60)
GLUCOSE: 268 mg/dL — AB (ref 65–99)
GLUCOSE: 352 mg/dL — AB (ref 65–99)
Potassium: 2.1 mmol/L — CL (ref 3.5–5.1)
Potassium: 2.4 mmol/L — CL (ref 3.5–5.1)
Sodium: 146 mmol/L — ABNORMAL HIGH (ref 135–145)
Sodium: 143 mmol/L (ref 135–145)

## 2015-09-09 LAB — HEMOGLOBIN A1C: Hgb A1c MFr Bld: 16.1 % — ABNORMAL HIGH (ref 4.0–6.0)

## 2015-09-09 LAB — POTASSIUM: Potassium: 2.4 mmol/L — CL (ref 3.5–5.1)

## 2015-09-09 LAB — BRAIN NATRIURETIC PEPTIDE: B Natriuretic Peptide: 169 pg/mL — ABNORMAL HIGH (ref 0.0–100.0)

## 2015-09-09 MED ORDER — POTASSIUM CHLORIDE 10 MEQ/100ML IV SOLN
10.0000 meq | INTRAVENOUS | Status: AC
  Administered 2015-09-09 (×4): 10 meq via INTRAVENOUS
  Filled 2015-09-09 (×4): qty 100

## 2015-09-09 MED ORDER — METOPROLOL TARTRATE 25 MG PO TABS: 25.0000 mg | ORAL_TABLET | Freq: Two times a day (BID) | ORAL | Status: AC

## 2015-09-09 MED ORDER — CEPHALEXIN 250 MG PO CAPS: 250.0000 mg | ORAL_CAPSULE | Freq: Two times a day (BID) | ORAL | Status: AC

## 2015-09-09 MED ORDER — POTASSIUM CHLORIDE CRYS ER 20 MEQ PO TBCR
40.0000 meq | EXTENDED_RELEASE_TABLET | ORAL | Status: AC
  Administered 2015-09-09 (×2): 40 meq via ORAL
  Filled 2015-09-09 (×2): qty 2

## 2015-09-09 MED ORDER — POTASSIUM CHLORIDE ER 8 MEQ PO TBCR: 8.0000 meq | EXTENDED_RELEASE_TABLET | Freq: Two times a day (BID) | ORAL | Status: AC

## 2015-09-09 NOTE — Progress Notes (Signed)
Reported Dr Luberta Mutter pt's potassium level 2.4 no new orders. Will wait to be rechecked at 1900 today

## 2015-09-09 NOTE — Progress Notes (Signed)
Pt's Potasium still low this morning, MD, Kalisetti aware. Will continue to monitor.

## 2015-09-09 NOTE — Progress Notes (Signed)
CRITICAL VALUE ALERT  Critical value received:  Potassium-- 2.4  Date of notification:  09/09/2015  Time of notification:  1615  Critical value read back:yes  Nurse who received alert:  Luther Parody  MD notified (1st page):  [place]  Time of first page:  1620  Received ordered from Dr. Judithann Sheen for 4 more runs of IV potassium. Order placed, waiting for potassium to come from pharmacy.  Trudee Kuster

## 2015-09-09 NOTE — Progress Notes (Signed)
[facility] - [place] at [facility]   PATIENT NAME: Timothy Malone    MR#:  [id number]  DATE OF BIRTH:  12/05/1955  SUBJECTIVE: Admitted for severe hypokalemia. Getting replacement of potassium in IV. She denies shortness of breath, . Eager to go home.  CHIEF COMPLAINT:   Chief Complaint  Patient presents with  . Leg Swelling    REVIEW OF SYSTEMS:    Review of Systems  Constitutional: Negative for fever and chills.  HENT: Negative for hearing loss.   Eyes: Negative for blurred vision, double vision and photophobia.  Respiratory: Negative for cough, hemoptysis and shortness of breath.   Cardiovascular: Positive for leg swelling. Negative for palpitations and orthopnea.  Gastrointestinal: Negative for vomiting, abdominal pain and diarrhea.  Genitourinary: Negative for dysuria and urgency.  Musculoskeletal: Negative for myalgias and neck pain.  Skin: Negative for rash.  Neurological: Negative for dizziness, focal weakness, seizures, weakness and headaches.  Psychiatric/Behavioral: Negative for memory loss. The patient does not have insomnia.     Nutrition:  Tolerating Diet: Tolerating PT:      DRUG ALLERGIES:  No Known Allergies  VITALS:  Blood pressure 142/86, pulse 78, temperature 97.7 F (36.5 C), temperature source Oral, resp. rate 18, height  (1.88 m), weight 120.475 kg (265 lb 9.6 oz), SpO2 94 %.  PHYSICAL EXAMINATION:   Physical Exam  GENERAL:  60 y.o.-year-old patient lying in the bed with no acute distress.  EYES: Pupils equal, round, reactive to light and accommodation. No scleral icterus. Extraocular muscles intact.  HEENT: Head atraumatic, normocephalic. Oropharynx and nasopharynx clear.  NECK:  Supple, no jugular venous distention. No thyroid enlargement, no tenderness.  LUNGS: Normal breath sounds bilaterally, no wheezing, rales,rhonchi or crepitation. No use of accessory muscles of respiration.  CARDIOVASCULAR: S1, S2  normal. No murmurs, rubs, or gallops.  ABDOMEN: Soft, nontender, nondistended. Bowel sounds present. No organomegaly or mass.  EXTREMITIES: No pedal edema, cyanosis, or clubbing.  NEUROLOGIC: Cranial nerves II through XII are intact. Muscle strength 5/5 in all extremities. Sensation intact. Gait not checked.  PSYCHIATRIC: The patient is alert and oriented x 3.  SKIN: No obvious rash, lesion, or ulcer.    LABORATORY PANEL:   CBC  Recent Labs Lab 09/09/15 0530  WBC 11.5*  HGB 12.0*  HCT 37.4*  PLT 264   ------------------------------------------------------------------------------------------------------------------  Chemistries   Recent Labs Lab 09/08/15 1840 09/09/15 0530  NA 143 146*  K <2.0* 2.1*  CL 94* 92*  CO2 42* 45*  GLUCOSE 63* 268*  BUN 15 15  CREATININE 0.60* 0.68  CALCIUM 7.4* 7.3*  MG 1.7  --   AST 49*  --   ALT 73*  --   ALKPHOS 85  --   BILITOT 1.0  --    ------------------------------------------------------------------------------------------------------------------  Cardiac Enzymes No results for input(s): TROPONINI in the last 168 hours. ------------------------------------------------------------------------------------------------------------------  RADIOLOGY:  Dg Chest 2 View  09/08/2015  CLINICAL DATA:  Leg edema EXAM: CHEST  2 VIEW COMPARISON:  03/14/2009 FINDINGS: Borderline cardiomegaly. Stable aortic and hilar contours. There is no edema, consolidation, effusion, or pneumothorax. IMPRESSION: Negative for edema. Electronically Signed   By: Marnee Spring M.D.   On: 09/08/2015 23:20     ASSESSMENT AND PLAN:   Principal Problem:   Hypokalemia   #1 severe hypokalemia likely secondary to diuretics. Patient takes Lasix at home but does not take potassium. Continue aggressive replacement of IV potassium, repeat by BMP now and if potassium is more than  3, patient can be discharged home. I ordered potassium supplements at home along with  Lasix, and follow-up with the physician at the  [facility]. #2 impetigo of the legs: Bacitracin, Keflex. #3 diabetes mellitus type 2. #4 bilateral leg edema, possible CHF: Check echocardiogram before discharge.  Mild hypernatremia: All the records are reviewed and case discussed with Care Management/Social Workerr. Management plans discussed with the patient, family and they are in agreement.  CODE STATUS: full  TOTAL TIME TAKING CARE OF THIS PATIENT: .   POSSIBLE D/C IN 1 DAYS, DEPENDING ON CLINICAL CONDITION.   Katha Hamming M.D on 09/09/2015 at 12:09 PM  Between 7am to 6pm - Pager - [phone]  After 6pm go to [email or web address] - password [email or web address] [facility]  [place] [facility]  Office  [phone]  CC: Primary care physician; No primary care provider on file.

## 2015-09-09 NOTE — Progress Notes (Signed)
*  PRELIMINARY RESULTS* Echocardiogram 2D Echocardiogram has been performed.  Garrel Ridgel Stills 09/09/2015, 2:15 PM

## 2015-09-10 LAB — BASIC METABOLIC PANEL
Anion gap: 8 (ref 5–15)
BUN: 18 mg/dL (ref 6–20)
CHLORIDE: 91 mmol/L — AB (ref 101–111)
CO2: 45 mmol/L — ABNORMAL HIGH (ref 22–32)
CREATININE: 0.79 mg/dL (ref 0.61–1.24)
Calcium: 7.5 mg/dL — ABNORMAL LOW (ref 8.9–10.3)
GFR calc Af Amer: >60 mL/min (ref >60)
Glucose, Bld: 249 mg/dL — ABNORMAL HIGH (ref 65–99)
Potassium: 2.6 mmol/L — CL (ref 3.5–5.1)
SODIUM: 144 mmol/L (ref 135–145)

## 2015-09-10 LAB — GLUCOSE, CAPILLARY
GLUCOSE-CAPILLARY: 304 mg/dL — AB (ref 65–99)
Glucose-Capillary: 219 mg/dL — ABNORMAL HIGH (ref 65–99)
Glucose-Capillary: 292 mg/dL — ABNORMAL HIGH (ref 65–99)

## 2015-09-10 LAB — POTASSIUM: Potassium: 3.4 mmol/L — ABNORMAL LOW (ref 3.5–5.1)

## 2015-09-10 LAB — MAGNESIUM: MAGNESIUM: 1.8 mg/dL (ref 1.7–2.4)

## 2015-09-10 MED ORDER — CEPHALEXIN 500 MG PO CAPS
500.0000 mg | ORAL_CAPSULE | Freq: Once | ORAL | Status: AC
  Administered 2015-09-10: 500 mg via ORAL
  Filled 2015-09-10: qty 1

## 2015-09-10 MED ORDER — POTASSIUM CHLORIDE 10 MEQ/100ML IV SOLN
10.0000 meq | INTRAVENOUS | Status: AC
  Administered 2015-09-10 (×4): 10 meq via INTRAVENOUS
  Filled 2015-09-10 (×4): qty 100

## 2015-09-10 MED ORDER — POTASSIUM CHLORIDE 20 MEQ PO PACK: 40.0000 meq | PACK | Freq: Once | ORAL | Status: DC

## 2015-09-10 MED ORDER — POTASSIUM CHLORIDE 20 MEQ PO PACK
40.0000 meq | PACK | ORAL | Status: AC
  Administered 2015-09-10 (×3): 40 meq via ORAL
  Filled 2015-09-10 (×3): qty 2

## 2015-09-10 NOTE — Consult Note (Signed)
MEDICATION RELATED CONSULT NOTE - FOLLOW UP  Pharmacy Consult for Potassium Managament Indication: Hypokalemia  No Known Allergies  Patient Measurements: Height:  (188 cm) Weight: 265 lb 9.6 oz (120.475 kg) IBW/kg (Calculated) : 82.2  Vital Signs: Temp: 97.8 F (36.6 C) (02/05 1127) Temp Source: Oral (02/05 1127) BP: 152/79 mmHg (02/05 1127) Pulse Rate: 42 (02/05 1127) Intake/Output from previous day: 02/04 0701 - 02/05 0700 In: 1120 [P.O.:720; IV Piggyback:400] Out: 2150 [Urine:2150] Intake/Output from this shift:    Labs:  Recent Labs  09/08/15 1840 09/09/15 0530 09/09/15 1241 09/10/15 0809  WBC 13.1* 11.5*  --   --   HGB 12.2* 12.0*  --   --   HCT 37.5* 37.4*  --   --   PLT 282 264  --   --   CREATININE 0.60* 0.68 0.72 0.79  MG 1.7  --   --  1.8  ALBUMIN 2.6*  --   --   --   PROT 5.5*  --   --   --   AST 49*  --   --   --   ALT 73*  --   --   --   ALKPHOS 85  --   --   --   BILITOT 1.0  --   --   --    Estimated Creatinine Clearance: 132 mL/min (by C-G formula based on Cr of 0.79).  Assessment: AP is a 60 yo male currently being treated for hypokalemia. Pharmacy consulted to manage potassium in this patient.  Patient's K this morning was 2.6. Patient received 4 runs of IV potassium (10 mEq x 4 runs) as well as 40 mEq PO x 3 doses today. Mg was WNL at 1.8.  K  = 3.4. K has greatly improved and only remains slightly low.  Goal of Therapy:  Potassium WNL  Plan:  Will give one more dose of KCl 40 mEq PO tonight Ordered K and Mg with AM labs tomorrow.   Pharmacy will continue to follow. Thank you for the consult.  Cindi Carbon, PharmD Clinical Pharmacist 09/10/2015,7:04 PM

## 2015-09-10 NOTE — Progress Notes (Signed)
Pt alert and oriented x4, no complaints of pain or discomfort.  Bed in low position, call bell within reach.  Bed alarms on and functioning.  Assessment done and charted.  Will continue to monitor and do hourly rounding throughout the shift.  Pain noted   0/10.  Pt sitting up in the bed, call bell in reach.  Eating lunch and tolerating well.  Will continue to monitor the pt throughout the shift./

## 2015-09-10 NOTE — Progress Notes (Signed)
[facility] - [place] at [facility] [place]   PATIENT NAME: Timothy Malone    MR#:  [id number]  DATE OF BIRTH:  1956-08-03  SUBJECTIVE: Admitted for severe hypokalemia. Getting replacement of potassium in IV. - potassium is still low at 2.6 despite aggressive replacement of IV potassium. Patient is very frustrated about this. Unfortunately potassium is still  Low. will try KCl powder form. Potassium was up to 3 we will discharge him. Patient now understands and he is willing to stay.   CHIEF COMPLAINT:   Chief Complaint  Patient presents with  . Leg Swelling    REVIEW OF SYSTEMS:    Review of Systems  Constitutional: Negative for fever and chills.  HENT: Negative for hearing loss.   Eyes: Negative for blurred vision, double vision and photophobia.  Respiratory: Negative for cough, hemoptysis and shortness of breath.   Cardiovascular: Positive for leg swelling. Negative for palpitations and orthopnea.  Gastrointestinal: Negative for vomiting, abdominal pain and diarrhea.  Genitourinary: Negative for dysuria and urgency.  Musculoskeletal: Negative for myalgias and neck pain.  Skin: Negative for rash.  Neurological: Negative for dizziness, focal weakness, seizures, weakness and headaches.  Psychiatric/Behavioral: Negative for memory loss. The patient does not have insomnia.     Nutrition:  Tolerating Diet: Tolerating PT:      DRUG ALLERGIES:  No Known Allergies  VITALS:  Blood pressure 152/79, pulse 42, temperature 97.8 F (36.6 C), temperature source Oral, resp. rate 24, height  (1.88 m), weight 120.475 kg (265 lb 9.6 oz), SpO2 90 %.  PHYSICAL EXAMINATION:   Physical Exam  GENERAL:  60 y.o.-year-old patient lying in the bed with no acute distress.  EYES: Pupils equal, round, reactive to light and accommodation. No scleral icterus. Extraocular muscles intact.  HEENT: Head atraumatic, normocephalic. Oropharynx and nasopharynx clear.  NECK:  Supple,  no jugular venous distention. No thyroid enlargement, no tenderness.  LUNGS: Normal breath sounds bilaterally, no wheezing, rales,rhonchi or crepitation. No use of accessory muscles of respiration.  CARDIOVASCULAR: S1, S2 normal. No murmurs, rubs, or gallops.  ABDOMEN: Soft, nontender, nondistended. Bowel sounds present. No organomegaly or mass.  EXTREMITIES: Leg edema. NEUROLOGIC: Cranial nerves II through XII are intact. Muscle strength 5/5 in all extremities. Sensation intact. Gait not checked.  PSYCHIATRIC: The patient is alert and oriented x 3.  SKIN: No obvious rash, lesion, or ulcer.    LABORATORY PANEL:   CBC  Recent Labs Lab 09/09/15 0530  WBC 11.5*  HGB 12.0*  HCT 37.4*  PLT 264   ------------------------------------------------------------------------------------------------------------------  Chemistries   Recent Labs Lab 09/08/15 1840  09/10/15 0809  NA 143  < > 144  K <2.0*  < > 2.6*  CL 94*  < > 91*  CO2 42*  < > 45*  GLUCOSE 63*  < > 249*  BUN 15  < > 18  CREATININE 0.60*  < > 0.79  CALCIUM 7.4*  < > 7.5*  MG 1.7  --  1.8  AST 49*  --   --   ALT 73*  --   --   ALKPHOS 85  --   --   BILITOT 1.0  --   --   < > = values in this interval not displayed. ------------------------------------------------------------------------------------------------------------------  Cardiac Enzymes No results for input(s): TROPONINI in the last 168 hours. ------------------------------------------------------------------------------------------------------------------  RADIOLOGY:  Dg Chest 2 View  09/08/2015  CLINICAL DATA:  Leg edema EXAM: CHEST  2 VIEW COMPARISON:  03/14/2009 FINDINGS: Borderline cardiomegaly.  Stable aortic and hilar contours. There is no edema, consolidation, effusion, or pneumothorax. IMPRESSION: Negative for edema. Electronically Signed   By: Marnee Spring M.D.   On: 09/08/2015 23:20     ASSESSMENT AND PLAN:   Principal Problem:    Hypokalemia   #1 severe hypokalemia likely secondary to diuretics. Patient takes Lasix at home but does not take potassium.   Hypokalemia present start KCl powder form. Repeat the potassium in the evening if it is more than 3 discharge him home. Discharge instructions are already placed yesterday.  #2 impetigo of the legs: Bacitracin, Keflex. #3 diabetes mellitus type 2. #4 bilateral leg edema, possible CHF: Echo results are still pending.  Mild hypernatremia: Resolved  All the records are reviewed and case discussed with Care Management/Social Workerr. Management plans discussed with the patient, family and they are in agreement.  CODE STATUS: full  TOTAL TIME TAKING CARE OF THIS PATIENT: .   POSSIBLE D/C IN 1 DAYS, DEPENDING ON CLINICAL CONDITION.   Katha Hamming M.D on 09/10/2015 at 11:32 AM  Between 7am to 6pm - Pager - [phone]  After 6pm go to [email or web address] - password [email or web address] [facility]  [place] [facility]  Office  [phone]  CC: Primary care physician; No primary care provider on file.

## 2015-09-10 NOTE — Progress Notes (Signed)
Per Dr. Suzanne Boron note and previous RN okay to discharge patient home if evening potassium lab are more than 3. Per lab potassium level is 3.4.

## 2015-09-10 NOTE — Consult Note (Signed)
MEDICATION RELATED CONSULT NOTE - INITIAL   Pharmacy Consult for Potassium Managament Indication: Hypokalemia  No Known Allergies  Patient Measurements: Height:  (188 cm) Weight: 265 lb 9.6 oz (120.475 kg) IBW/kg (Calculated) : 82.2  Vital Signs: Temp: 98.1 F (36.7 C) (02/05 0809) Temp Source: Oral (02/05 0809) BP: 125/71 mmHg (02/05 0809) Pulse Rate: 80 (02/05 0912) Intake/Output from previous day: 02/04 0701 - 02/05 0700 In: 1120 [P.O.:720; IV Piggyback:400] Out: 2150 [Urine:2150] Intake/Output from this shift: Total I/O In: 240 [P.O.:240] Out: 0   Labs:  Recent Labs  09/08/15 1840 09/09/15 0530 09/09/15 1241 09/10/15 0809  WBC 13.1* 11.5*  --   --   HGB 12.2* 12.0*  --   --   HCT 37.5* 37.4*  --   --   PLT 282 264  --   --   CREATININE 0.60* 0.68 0.72 0.79  MG 1.7  --   --  1.8  ALBUMIN 2.6*  --   --   --   PROT 5.5*  --   --   --   AST 49*  --   --   --   ALT 73*  --   --   --   ALKPHOS 85  --   --   --   BILITOT 1.0  --   --   --    Estimated Creatinine Clearance: 132 mL/min (by C-G formula based on Cr of 0.79).   Microbiology: No results found for this or any previous visit (from the past 720 hour(s)).  Medical History: Past Medical History  Diagnosis Date  . CHF (congestive heart failure) (HCC)   . Diabetes mellitus without complication (HCC)   . Glaucoma   . Hypertension     Medications:  Scheduled:  . folic acid  1 mg Oral Daily  . heparin  5,000 Units Subcutaneous 3 times per day  . insulin aspart  0-5 Units Subcutaneous QHS  . insulin aspart  0-9 Units Subcutaneous TID WC  . insulin glargine  30 Units Subcutaneous QHS  . metoprolol tartrate  25 mg Oral BID  . multivitamin with minerals  1 tablet Oral Q supper  . potassium chloride  40 mEq Oral Q4H  . potassium chloride  10 mEq Intravenous Q1 Hr x 4  . sodium chloride flush  3 mL Intravenous Q12H  . sodium chloride flush  3 mL Intravenous Q12H  . thiamine  100 mg Oral Daily    Or  . thiamine  100 mg Intravenous Daily   Infusions:    Assessment: Timothy Malone is a 60 yo male currently being treated for hypokalemia. Pharmacy consulted to manage potassium in this patient.  Goal of Therapy:  Potassium WNL  Plan:  Patient is s/p significant IV and PO K supplementation. Patient received K x 8 yesterday.  K this AM 2.6, patient currently receiving K packet PO x3.   Will order 4 more K runs, x4 and recheck K at 1800.   Pharmacy will continue to follow.   Cy Blamer 09/10/2015,11:25 AM

## 2015-09-11 NOTE — Discharge Summary (Signed)
Timothy Malone, is a 60 y.o. male  DOB 08-10-1955  MRN [id number].  Admission date:  09/08/2015  Admitting Physician  Shaune Pollack, MD  Discharge Date:  09/11/2015   Primary MD  No primary care provider on file.  Recommendations for primary care physician for things to follow:   Follow-up with physician in the [facility] in one week   Admission Diagnosis  Hypokalemia [E87.6] Peripheral edema [R60.9] Right arm weakness [R29.898]   Discharge Diagnosis  Hypokalemia [E87.6] Peripheral edema [R60.9] Right arm weakness [R29.898]    Principal Problem:   Hypokalemia      Past Medical History  Diagnosis Date  . CHF (congestive heart failure) (HCC)   . Diabetes mellitus without complication (HCC)   . Glaucoma   . Hypertension     History reviewed. No pertinent past surgical history.     History of present illness and  Hospital Course:     Kindly see H&P for history of present illness and admission details, please review complete Labs, Consult reports and Test reports for all details in brief  HPI  from the history and physical done on the day of admission 60 year old male patient with hypertension, diabetes admitted because of hypokalemia. Patient went to urgent care because of leg edema and found to have hypokalemia and sent to ED for further evaluation.  Hospital Course  60 year old the male patient admitted because of severe hypokalemia, leg swelling. ; And potassium 2.1 on admission, he required multiple doses of IV potassium, by mouth potassium, patient's potassium finally improved to 3.4. Discharge home with KCl  8 meq  1 tablet by mouth twice a day.. #2. Edema: Likely acute on chronic diastolic heart failure. Patient never had shortness of breath. Echo is done but results pending. Patient to takes Lasix at home  advised him to continue that but also told him to take potassium with Lasix. #3 diabetes mellitus type 2: Patient takes Lantus 30 units at bedtime. #4.Skin scabs all Over the Body: Dried off. Looks like Impetigo. Given Keflex. *\   Discharge Condition: stable   Follow UP      Discharge Instructions  and  Discharge Medications        Medication List    TAKE these medications        cephALEXin 250 MG capsule  Commonly known as:  KEFLEX  Take 1 capsule (250 mg total) by mouth 2 (two) times daily.     furosemide 20 MG tablet  Commonly known as:  LASIX  Take 20 mg by mouth daily.     insulin aspart 100 UNIT/ML injection  Commonly known as:  novoLOG  Inject 0-25 Units into the skin 3 (three) times daily with meals as needed for high blood sugar. Pt uses as needed per sliding scale.     insulin glargine 100 UNIT/ML injection  Commonly known as:  LANTUS  Inject 30 Units into the skin at bedtime.     metoprolol tartrate 25 MG tablet  Commonly known as:  LOPRESSOR  Take 1 tablet (25 mg total) by mouth 2 (two) times daily.     potassium chloride 8 MEQ tablet  Commonly known as:  KLOR-CON  Take 1 tablet (8 mEq total) by mouth 2 (two) times daily.          Diet and Activity recommendation: See Discharge Instructions above   Consults obtained - none  Major procedures and Radiology Reports - PLEASE review detailed and final reports for all details, in  brief -     Dg Chest 2 View  09/08/2015  CLINICAL DATA:  Leg edema EXAM: CHEST  2 VIEW COMPARISON:  03/14/2009 FINDINGS: Borderline cardiomegaly. Stable aortic and hilar contours. There is no edema, consolidation, effusion, or pneumothorax. IMPRESSION: Negative for edema. Electronically Signed   By: Marnee Spring M.D.   On: 09/08/2015 23:20    Micro Results    No results found for this or any previous visit (from the past 240 hour(s)).     Today   Subjective:   Timothy Malone today has no headache,no chest  abdominal pain,no new weakness tingling or numbness, feels much better wants to go home today.   Objective:   Blood pressure 152/79, pulse 42, temperature 97.8 F (36.6 C), temperature source Oral, resp. rate 24, height  (1.88 m), weight 120.475 kg (265 lb 9.6 oz), SpO2 90 %.   Intake/Output Summary (Last 24 hours) at 09/11/15 1339 Last data filed at 09/10/15 1821  Gross per 24 hour  Intake    480 ml  Output    315 ml  Net    165 ml    Exam Awake Alert, Oriented x 3, No new F.N deficits, Normal affect Amber.AT,PERRAL Supple Neck,No JVD, No cervical lymphadenopathy appriciated.  Symmetrical Chest wall movement, Good air movement bilaterally, CTAB RRR,No Gallops,Rubs or new Murmurs, No Parasternal Heave +ve B.Sounds, Abd Soft, Non tender, No organomegaly appriciated, No rebound -guarding or rigidity. No Cyanosis, Clubbing or edema, No new Rash or bruise  Data Review   CBC w Diff: Lab Results  Component Value Date   WBC 11.5* 09/09/2015   HGB 12.0* 09/09/2015   HCT 37.4* 09/09/2015   PLT 264 09/09/2015   LYMPHOPCT 5* 03/14/2009   MONOPCT 6 03/14/2009   EOSPCT 0 03/14/2009   BASOPCT 0 03/14/2009    CMP: Lab Results  Component Value Date   NA 144 09/10/2015   K 3.4* 09/10/2015   CL 91* 09/10/2015   CO2 45* 09/10/2015   BUN 18 09/10/2015   CREATININE 0.79 09/10/2015   PROT 5.5* 09/08/2015   ALBUMIN 2.6* 09/08/2015   BILITOT 1.0 09/08/2015   ALKPHOS 85 09/08/2015   AST 49* 09/08/2015   ALT 73* 09/08/2015  .   Total Time in preparing paper work, data evaluation and todays exam - 35 minutes  Deeandra Jerry M.D on 09/11/2015 at 1:39 PM    Note: This dictation was prepared with Dragon dictation along with smaller phrase technology. Any transcriptional errors that result from this process are unintentional.

## 2016-01-04 DEATH — deceased

## 2016-07-13 IMAGING — CR DG CHEST 2V
1 series · 2 of 2 positions shown · non-contrast
Comparison: 03/14/2009

CLINICAL DATA: Leg edema

EXAM:
CHEST  2 VIEW

[Series 1: w chest pa · 0.14mm/px · 2 of 2 slices shown]
[im 1/2]
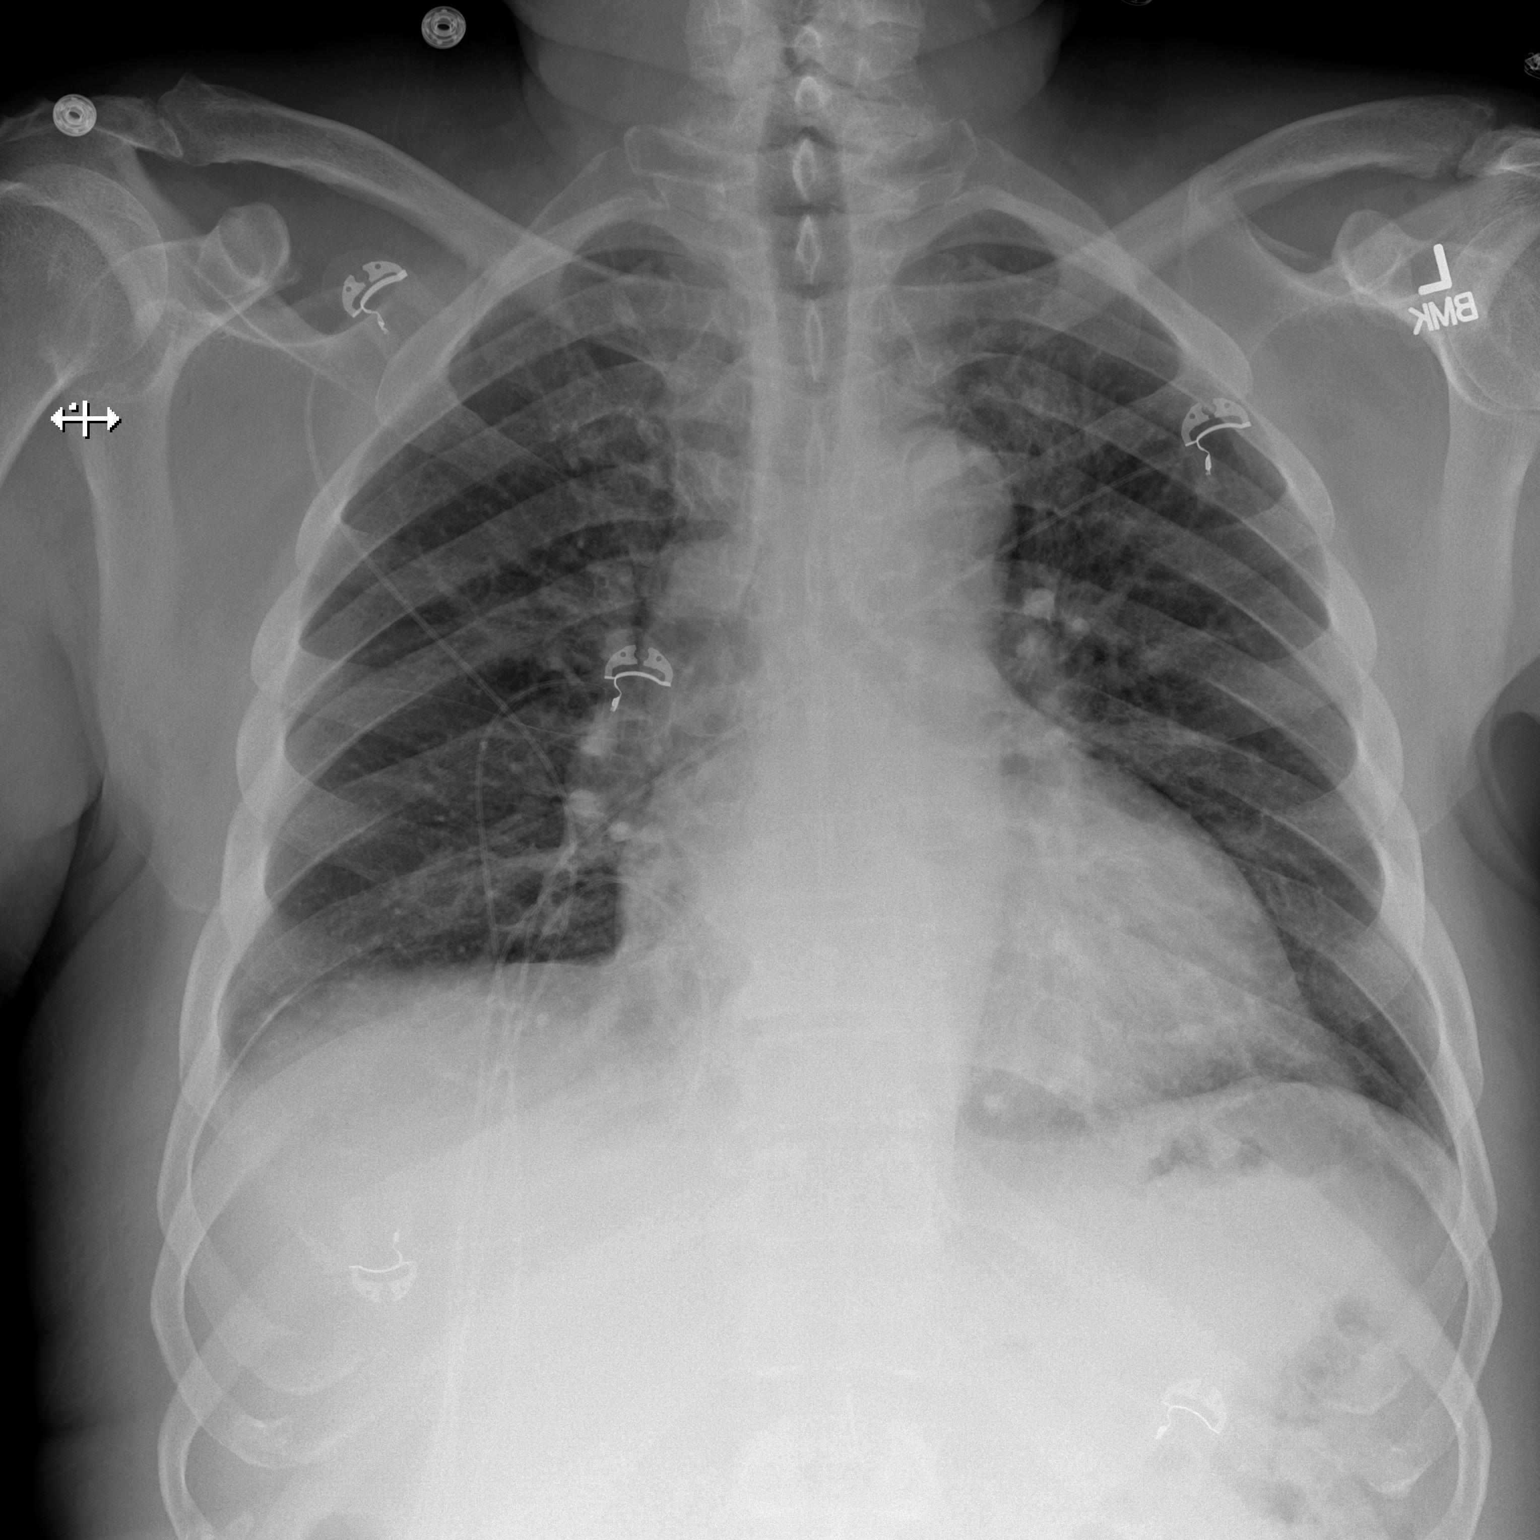
[im 2/2]
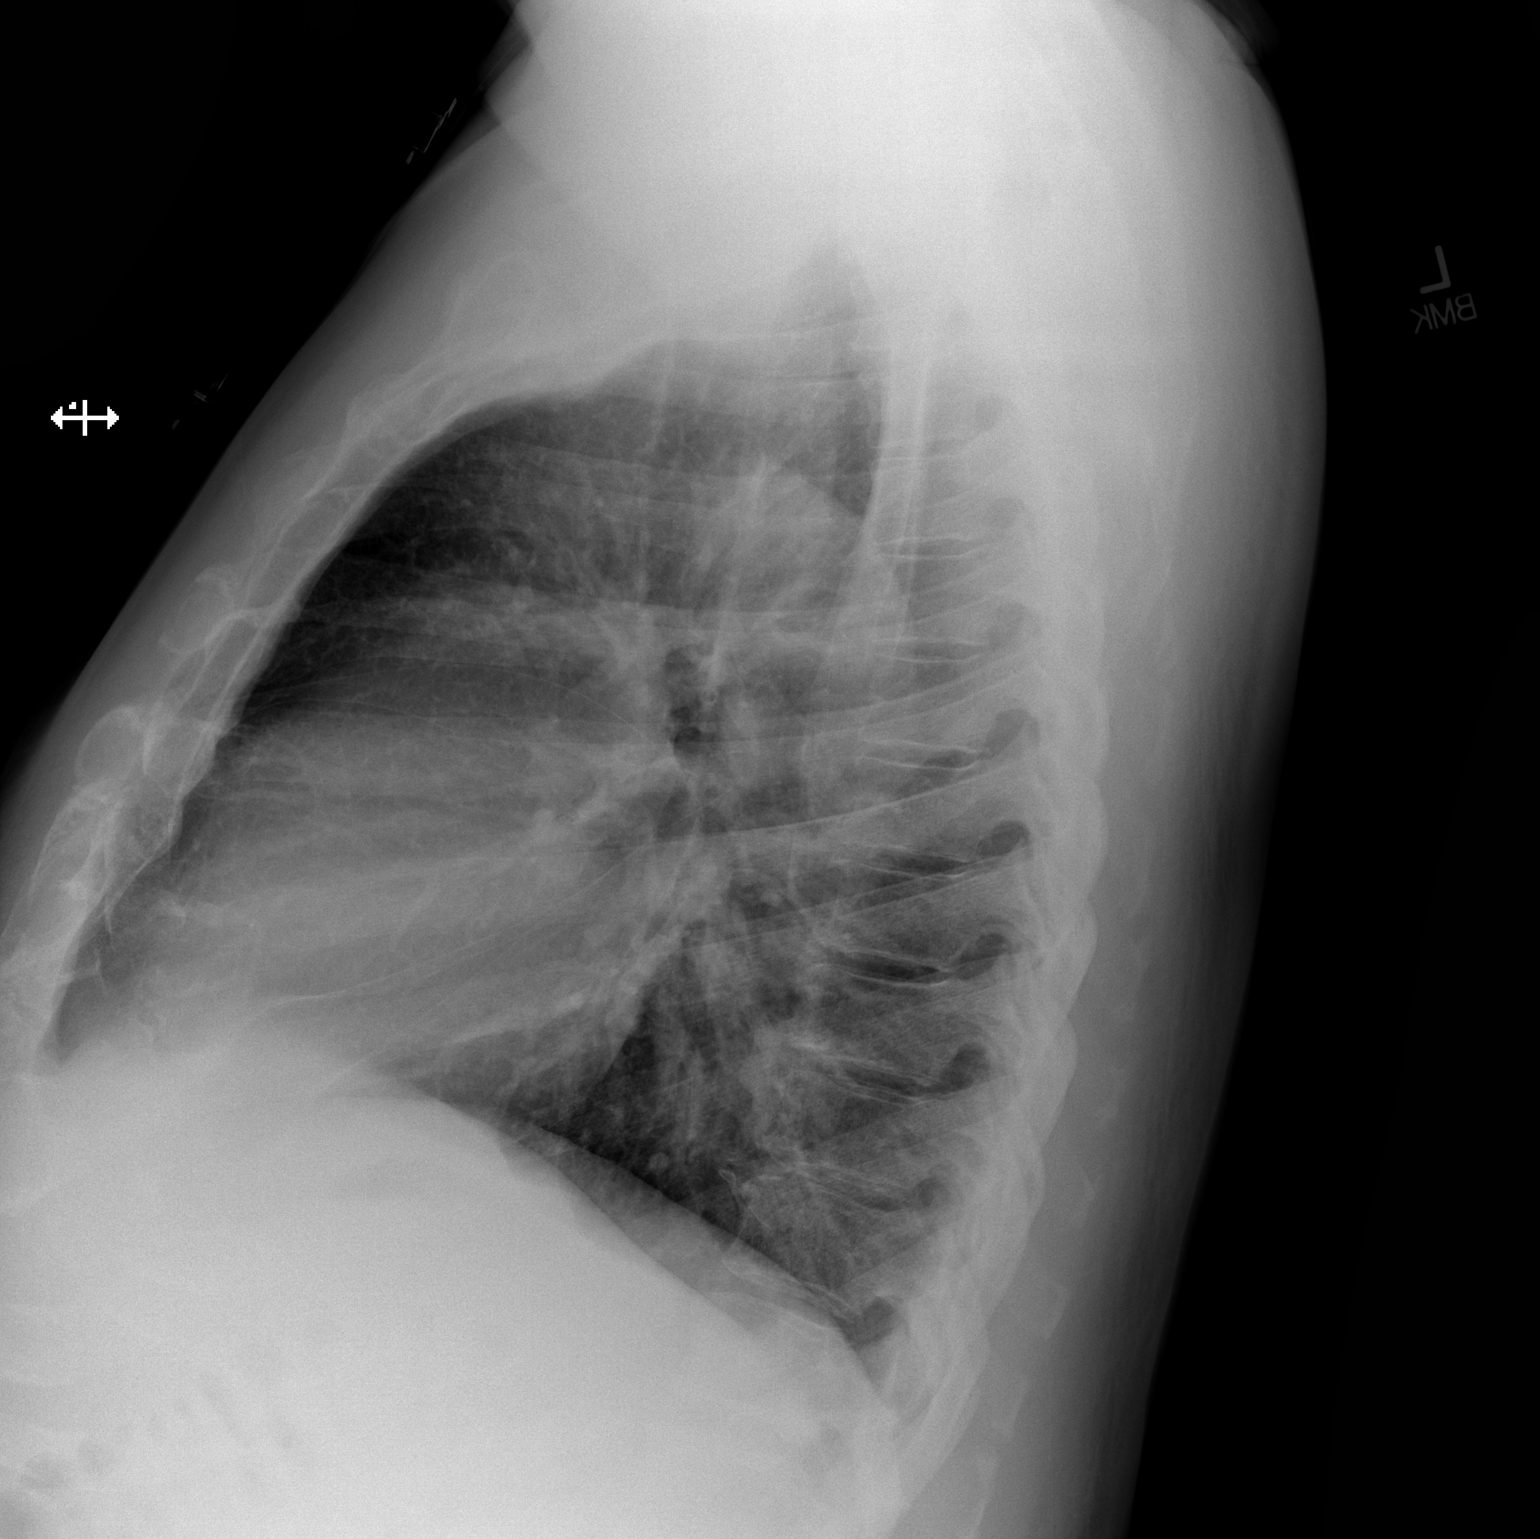

[2 of 2 positions shown; findings below may reference images not displayed]

FINDINGS: Borderline cardiomegaly. Stable aortic and hilar contours. There is
no edema, consolidation, effusion, or pneumothorax.
IMPRESSION: Negative for edema.
